# Patient Record
Sex: Female | Born: 1980 | Race: White | Hispanic: No | Marital: Married | Smoking: Never smoker
Health system: Southern US, Community
[De-identification: ages and names within clinical notes are randomized; demographics above are authoritative.]

## PROBLEM LIST (undated history)

## (undated) DIAGNOSIS — K5090 Crohn's disease, unspecified, without complications: Secondary | ICD-10-CM

---

## 2006-03-04 ENCOUNTER — Observation Stay: Payer: Self-pay | Admitting: Unknown Physician Specialty

## 2006-03-23 ENCOUNTER — Inpatient Hospital Stay: Payer: Self-pay

## 2007-12-15 ENCOUNTER — Emergency Department: Payer: Self-pay | Admitting: Emergency Medicine

## 2009-09-16 ENCOUNTER — Observation Stay: Payer: Self-pay | Admitting: Internal Medicine

## 2009-10-03 ENCOUNTER — Inpatient Hospital Stay: Payer: Self-pay

## 2012-12-15 ENCOUNTER — Other Ambulatory Visit: Payer: Self-pay | Admitting: Unknown Physician Specialty

## 2012-12-17 LAB — CLOSTRIDIUM DIFFICILE BY PCR

## 2013-01-05 ENCOUNTER — Other Ambulatory Visit: Payer: Self-pay | Admitting: Unknown Physician Specialty

## 2013-01-05 LAB — CLOSTRIDIUM DIFFICILE BY PCR

## 2013-06-16 ENCOUNTER — Other Ambulatory Visit: Payer: Self-pay | Admitting: Unknown Physician Specialty

## 2013-06-16 LAB — CLOSTRIDIUM DIFFICILE(ARMC)

## 2013-06-19 LAB — STOOL CULTURE

## 2013-07-22 ENCOUNTER — Other Ambulatory Visit: Payer: Self-pay

## 2016-04-25 ENCOUNTER — Other Ambulatory Visit
Admission: RE | Admit: 2016-04-25 | Discharge: 2016-04-25 | Disposition: A | Discharge status: Home / Self Care | Payer: Self-pay | Source: Ambulatory Visit | Attending: Student | Admitting: Student

## 2016-04-25 DIAGNOSIS — Z8619 Personal history of other infectious and parasitic diseases: Secondary | ICD-10-CM | POA: Insufficient documentation

## 2016-04-25 LAB — C DIFFICILE QUICK SCREEN W PCR REFLEX
C DIFFICILE (CDIFF) TOXIN: NEGATIVE
C DIFFICLE (CDIFF) ANTIGEN: NEGATIVE
C Diff interpretation: NOT DETECTED

## 2016-11-28 ENCOUNTER — Other Ambulatory Visit
Admission: RE | Admit: 2016-11-28 | Discharge: 2016-11-28 | Disposition: A | Discharge status: Home / Self Care | Payer: Self-pay | Source: Ambulatory Visit | Attending: Student | Admitting: Student

## 2016-11-28 DIAGNOSIS — K51911 Ulcerative colitis, unspecified with rectal bleeding: Secondary | ICD-10-CM | POA: Insufficient documentation

## 2016-11-28 LAB — GASTROINTESTINAL PANEL BY PCR, STOOL (REPLACES STOOL CULTURE)
Adenovirus F40/41: NOT DETECTED
Astrovirus: NOT DETECTED
CAMPYLOBACTER SPECIES: NOT DETECTED
CRYPTOSPORIDIUM: NOT DETECTED
Cyclospora cayetanensis: NOT DETECTED
ENTEROAGGREGATIVE E COLI (EAEC): NOT DETECTED
Entamoeba histolytica: NOT DETECTED
Enteropathogenic E coli (EPEC): NOT DETECTED
Enterotoxigenic E coli (ETEC): NOT DETECTED
GIARDIA LAMBLIA: NOT DETECTED
NOROVIRUS GI/GII: NOT DETECTED
PLESIMONAS SHIGELLOIDES: NOT DETECTED
Rotavirus A: NOT DETECTED
SAPOVIRUS (I, II, IV, AND V): NOT DETECTED
Salmonella species: NOT DETECTED
Shiga like toxin producing E coli (STEC): NOT DETECTED
Shigella/Enteroinvasive E coli (EIEC): NOT DETECTED
Vibrio cholerae: NOT DETECTED
Vibrio species: NOT DETECTED
YERSINIA ENTEROCOLITICA: NOT DETECTED

## 2016-11-28 LAB — C DIFFICILE QUICK SCREEN W PCR REFLEX
C Diff antigen: NEGATIVE
C Diff interpretation: NOT DETECTED
C Diff toxin: NEGATIVE

## 2016-12-25 ENCOUNTER — Other Ambulatory Visit
Admission: RE | Admit: 2016-12-25 | Discharge: 2016-12-25 | Disposition: A | Discharge status: Home / Self Care | Payer: Self-pay | Source: Ambulatory Visit | Attending: Student | Admitting: Student

## 2016-12-25 DIAGNOSIS — K51811 Other ulcerative colitis with rectal bleeding: Secondary | ICD-10-CM | POA: Insufficient documentation

## 2016-12-25 LAB — C DIFFICILE QUICK SCREEN W PCR REFLEX
C DIFFICILE (CDIFF) INTERP: NOT DETECTED
C DIFFICILE (CDIFF) TOXIN: NEGATIVE
C DIFFICLE (CDIFF) ANTIGEN: NEGATIVE

## 2017-03-31 ENCOUNTER — Other Ambulatory Visit
Admission: RE | Admit: 2017-03-31 | Discharge: 2017-03-31 | Disposition: A | Discharge status: Home / Self Care | Payer: BLUE CROSS/BLUE SHIELD | Source: Ambulatory Visit | Attending: Student | Admitting: Student

## 2017-03-31 DIAGNOSIS — K51911 Ulcerative colitis, unspecified with rectal bleeding: Secondary | ICD-10-CM | POA: Insufficient documentation

## 2017-04-01 ENCOUNTER — Other Ambulatory Visit
Admission: RE | Admit: 2017-04-01 | Discharge: 2017-04-01 | Disposition: A | Discharge status: Home / Self Care | Payer: BLUE CROSS/BLUE SHIELD | Source: Ambulatory Visit | Attending: Student | Admitting: Student

## 2017-04-01 DIAGNOSIS — K51911 Ulcerative colitis, unspecified with rectal bleeding: Secondary | ICD-10-CM | POA: Diagnosis present

## 2017-04-01 LAB — C DIFFICILE QUICK SCREEN W PCR REFLEX
C Diff antigen: NEGATIVE
C Diff interpretation: NOT DETECTED
C Diff toxin: NEGATIVE

## 2017-04-01 LAB — GASTROINTESTINAL PANEL BY PCR, STOOL (REPLACES STOOL CULTURE)
ADENOVIRUS F40/41: NOT DETECTED
Astrovirus: NOT DETECTED
CAMPYLOBACTER SPECIES: NOT DETECTED
CRYPTOSPORIDIUM: NOT DETECTED
CYCLOSPORA CAYETANENSIS: NOT DETECTED
ENTAMOEBA HISTOLYTICA: NOT DETECTED
ENTEROPATHOGENIC E COLI (EPEC): NOT DETECTED
Enteroaggregative E coli (EAEC): NOT DETECTED
Enterotoxigenic E coli (ETEC): NOT DETECTED
Giardia lamblia: NOT DETECTED
Norovirus GI/GII: NOT DETECTED
PLESIMONAS SHIGELLOIDES: NOT DETECTED
Rotavirus A: NOT DETECTED
SAPOVIRUS (I, II, IV, AND V): NOT DETECTED
Salmonella species: NOT DETECTED
Shiga like toxin producing E coli (STEC): NOT DETECTED
Shigella/Enteroinvasive E coli (EIEC): NOT DETECTED
VIBRIO CHOLERAE: NOT DETECTED
VIBRIO SPECIES: NOT DETECTED
YERSINIA ENTEROCOLITICA: NOT DETECTED

## 2017-04-07 LAB — MISCELLANEOUS TEST

## 2017-06-10 ENCOUNTER — Other Ambulatory Visit
Admission: RE | Admit: 2017-06-10 | Discharge: 2017-06-10 | Disposition: A | Discharge status: Home / Self Care | Payer: BLUE CROSS/BLUE SHIELD | Source: Ambulatory Visit | Attending: Student | Admitting: Student

## 2017-06-10 DIAGNOSIS — R197 Diarrhea, unspecified: Secondary | ICD-10-CM | POA: Diagnosis present

## 2017-06-10 LAB — GASTROINTESTINAL PANEL BY PCR, STOOL (REPLACES STOOL CULTURE)

## 2017-06-10 LAB — C DIFFICILE QUICK SCREEN W PCR REFLEX
C DIFFICILE (CDIFF) TOXIN: NEGATIVE
C DIFFICLE (CDIFF) ANTIGEN: NEGATIVE
C Diff interpretation: NOT DETECTED

## 2017-06-15 LAB — STOOL CULTURE: E coli, Shiga toxin Assay: NEGATIVE

## 2017-06-15 LAB — STOOL CULTURE REFLEX - CMPCXR

## 2017-06-15 LAB — STOOL CULTURE REFLEX - RSASHR

## 2017-08-27 ENCOUNTER — Other Ambulatory Visit: Payer: Self-pay | Admitting: General Surgery

## 2017-08-27 ENCOUNTER — Ambulatory Visit
Admission: RE | Admit: 2017-08-27 | Discharge: 2017-08-27 | Disposition: A | Discharge status: Home / Self Care | Payer: BLUE CROSS/BLUE SHIELD | Source: Ambulatory Visit | Attending: General Surgery | Admitting: General Surgery

## 2017-08-27 DIAGNOSIS — N83202 Unspecified ovarian cyst, left side: Secondary | ICD-10-CM | POA: Diagnosis not present

## 2017-08-27 DIAGNOSIS — L0291 Cutaneous abscess, unspecified: Secondary | ICD-10-CM

## 2017-08-27 DIAGNOSIS — K610 Anal abscess: Secondary | ICD-10-CM | POA: Diagnosis not present

## 2017-08-27 MED ORDER — IOHEXOL 300 MG/ML  SOLN
100.0000 mL | Freq: Once | INTRAMUSCULAR | Status: AC | PRN
  Administered 2017-08-27: 100 mL via INTRAVENOUS

## 2018-07-22 ENCOUNTER — Other Ambulatory Visit
Admission: RE | Admit: 2018-07-22 | Discharge: 2018-07-22 | Disposition: A | Discharge status: Home / Self Care | Payer: BLUE CROSS/BLUE SHIELD | Source: Ambulatory Visit | Attending: Unknown Physician Specialty | Admitting: Unknown Physician Specialty

## 2018-07-22 DIAGNOSIS — K51811 Other ulcerative colitis with rectal bleeding: Secondary | ICD-10-CM | POA: Diagnosis present

## 2018-07-22 LAB — GASTROINTESTINAL PANEL BY PCR, STOOL (REPLACES STOOL CULTURE)

## 2018-07-22 LAB — C DIFFICILE QUICK SCREEN W PCR REFLEX
C Diff antigen: POSITIVE — AB
C Diff toxin: NEGATIVE

## 2018-07-22 LAB — CLOSTRIDIUM DIFFICILE BY PCR, REFLEXED

## 2018-07-23 LAB — CALPROTECTIN, FECAL: CALPROTECTIN, FECAL: 1382 ug/g — AB (ref 0–120)

## 2018-09-08 ENCOUNTER — Ambulatory Visit: Admission: RE | Admit: 2018-09-08 | Payer: BLUE CROSS/BLUE SHIELD | Source: Ambulatory Visit

## 2018-09-08 ENCOUNTER — Other Ambulatory Visit: Payer: Self-pay | Admitting: General Surgery

## 2018-09-08 ENCOUNTER — Other Ambulatory Visit: Payer: Self-pay

## 2018-09-08 ENCOUNTER — Ambulatory Visit
Admission: RE | Admit: 2018-09-08 | Discharge: 2018-09-08 | Disposition: A | Discharge status: Home / Self Care | Payer: BLUE CROSS/BLUE SHIELD | Source: Ambulatory Visit | Attending: General Surgery | Admitting: General Surgery

## 2018-09-08 ENCOUNTER — Other Ambulatory Visit (HOSPITAL_COMMUNITY): Payer: Self-pay | Admitting: General Surgery

## 2018-09-08 DIAGNOSIS — L0291 Cutaneous abscess, unspecified: Secondary | ICD-10-CM | POA: Diagnosis present

## 2018-09-08 MED ORDER — IOHEXOL 300 MG/ML  SOLN
100.0000 mL | Freq: Once | INTRAMUSCULAR | Status: AC | PRN
  Administered 2018-09-08: 100 mL via INTRAVENOUS

## 2018-09-22 ENCOUNTER — Other Ambulatory Visit
Admission: RE | Admit: 2018-09-22 | Discharge: 2018-09-22 | Disposition: A | Discharge status: Home / Self Care | Payer: BLUE CROSS/BLUE SHIELD | Source: Ambulatory Visit | Attending: Student | Admitting: Student

## 2018-09-22 DIAGNOSIS — K5190 Ulcerative colitis, unspecified, without complications: Secondary | ICD-10-CM | POA: Insufficient documentation

## 2018-09-22 LAB — GASTROINTESTINAL PANEL BY PCR, STOOL (REPLACES STOOL CULTURE)

## 2018-09-24 LAB — CALPROTECTIN, FECAL: Calprotectin, Fecal: 1190 ug/g — ABNORMAL HIGH (ref 0–120)

## 2019-12-16 IMAGING — CT CT PELVIS WITH CONTRAST
2 of 3 series · 16 of 46 positions shown, 18 images · IV contrast (omnipaque)
Comparison: 08/27/2016

CLINICAL DATA: Perirectal pain and erythema since September 06, 2018.
History of ulcerative colitis. History of perianal abscess about 1
year ago.

EXAM:
CT PELVIS WITH CONTRAST
TECHNIQUE: Multidetector CT imaging of the pelvis was performed using the
standard protocol following the bolus administration of intravenous
contrast.
CONTRAST:  100mL OMNIPAQUE IOHEXOL 300 MG/ML  SOLN

[Series 2: pelvis · axial · 0.77mm/px · z∈[-1607,-1317]mm · 13 of 68 slices shown, 15 images (1 of 2)]
[im 5/68  soft-tissue]
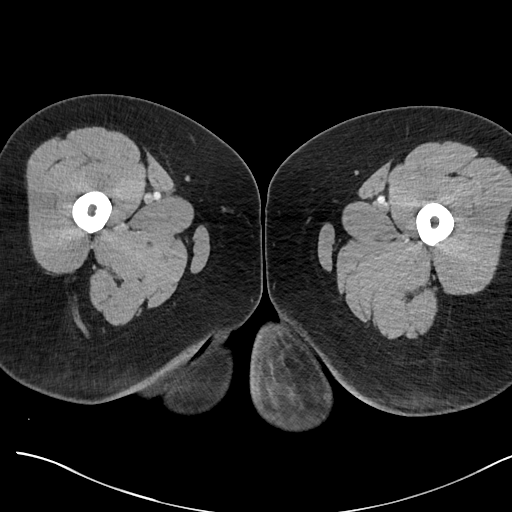
[im 5/68  bone]
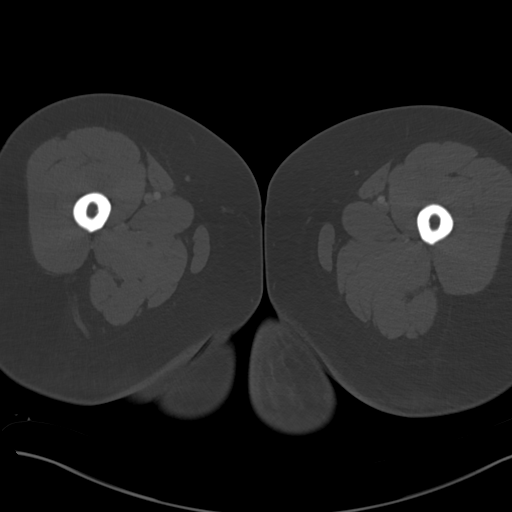
[im 9/68  soft-tissue]
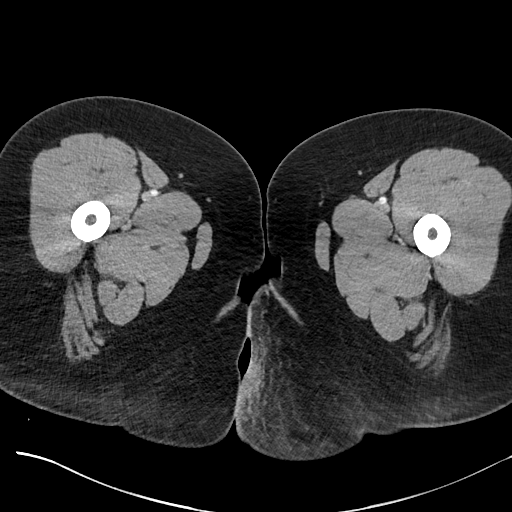
[im 13/68  soft-tissue]
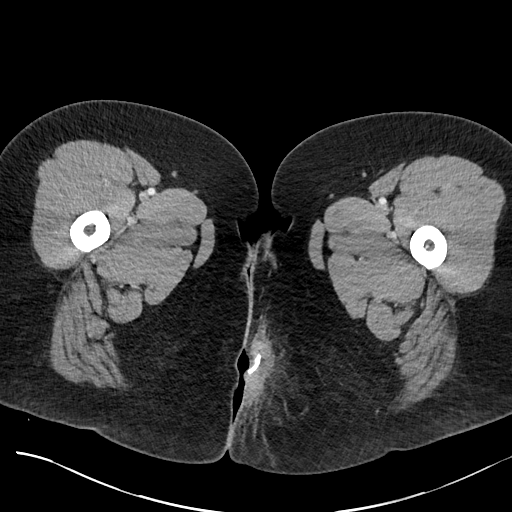
[im 20/68  soft-tissue]
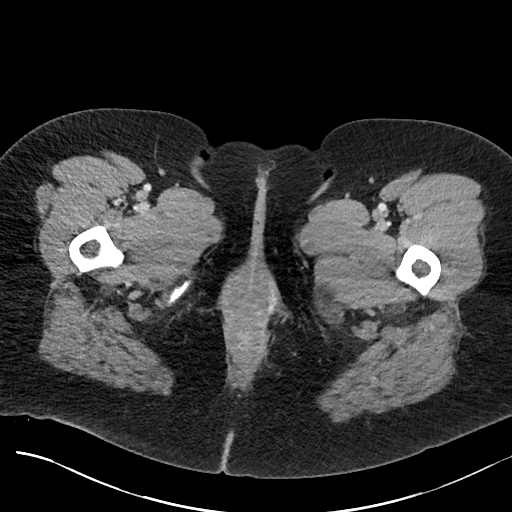
[im 24/68  soft-tissue]
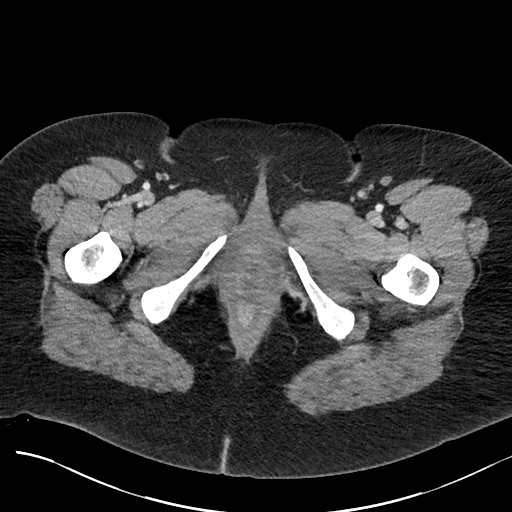
[im 29/68  soft-tissue]
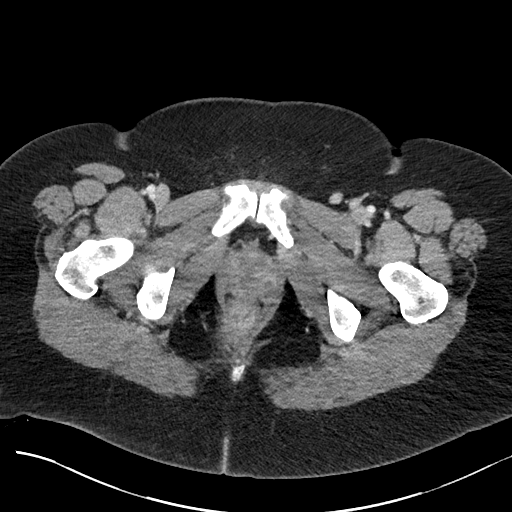
[im 35/68  soft-tissue]
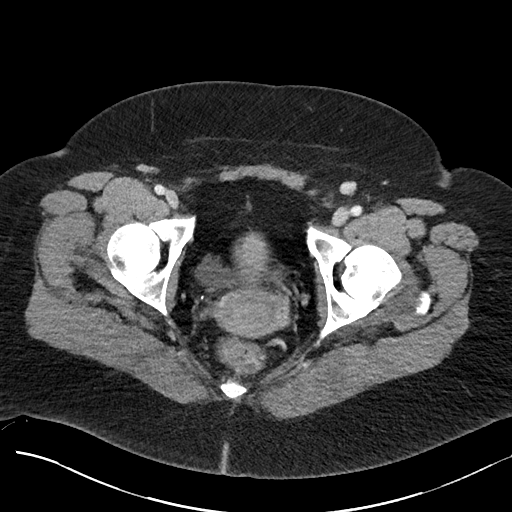
[im 39/68  soft-tissue]
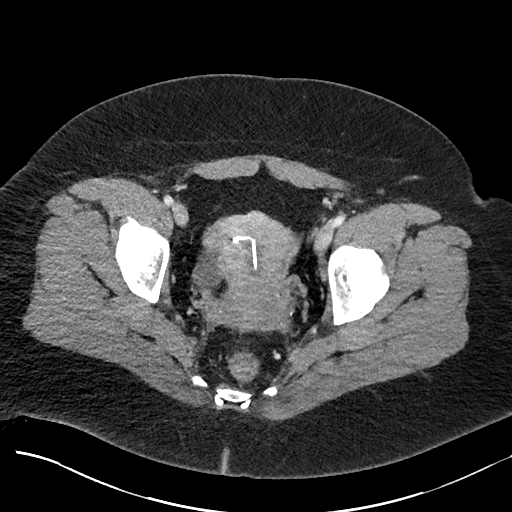
[im 44/68  soft-tissue]
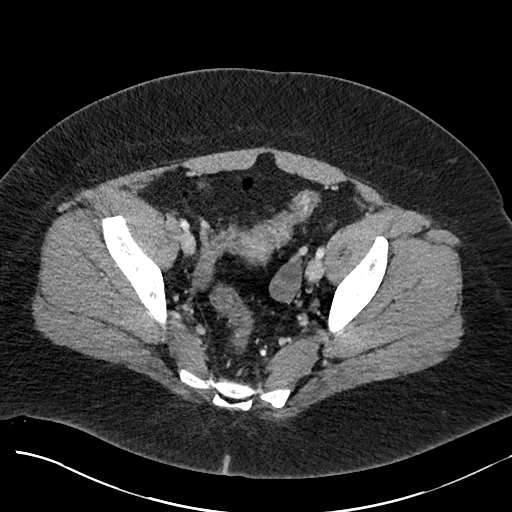
[im 44/68  bone]
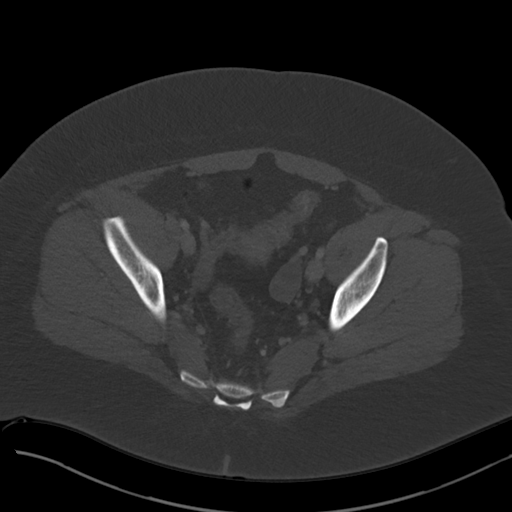
[im 48/68  soft-tissue]
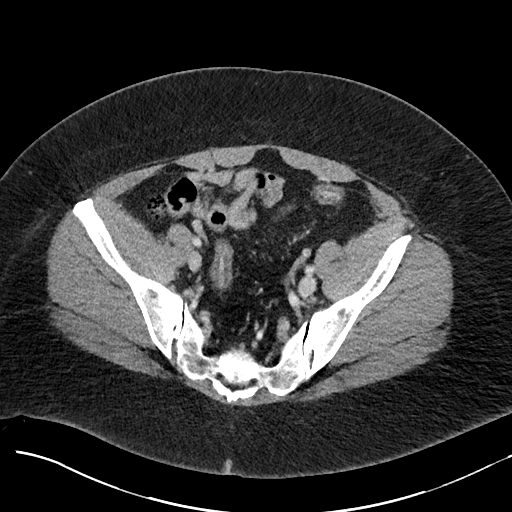
[im 55/68  soft-tissue]
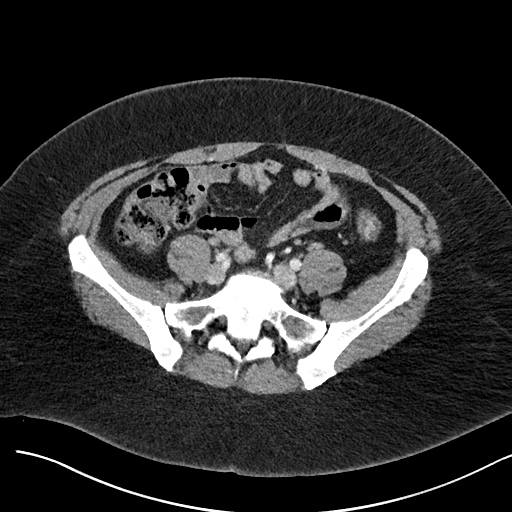
[im 59/68  soft-tissue]
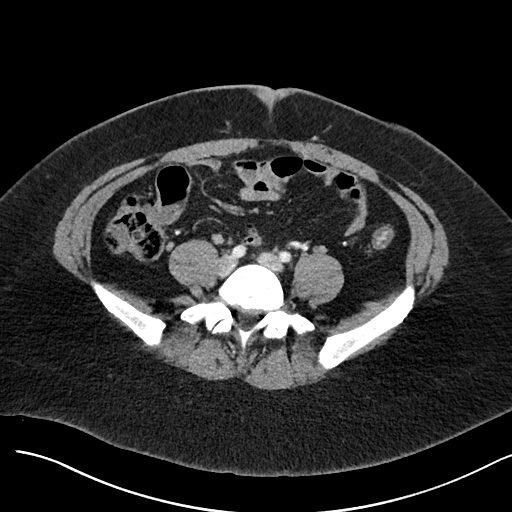
[im 63/68  soft-tissue]
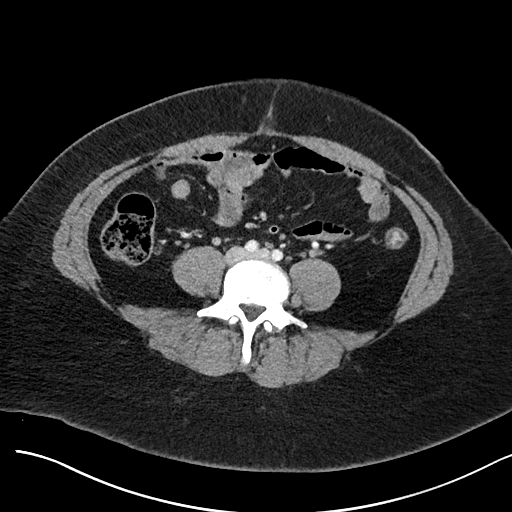

[Series 3: pelvis · coronal · 0.67mm/px · 3 of 164 slices shown (2 of 2)]
[im 55/164  soft-tissue]
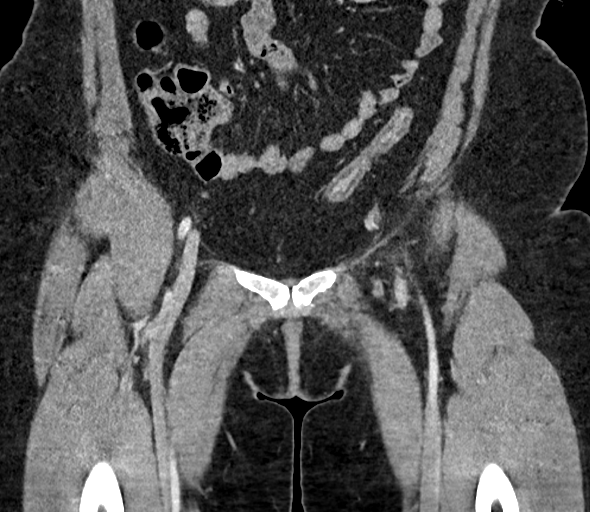
[im 73/164  soft-tissue]
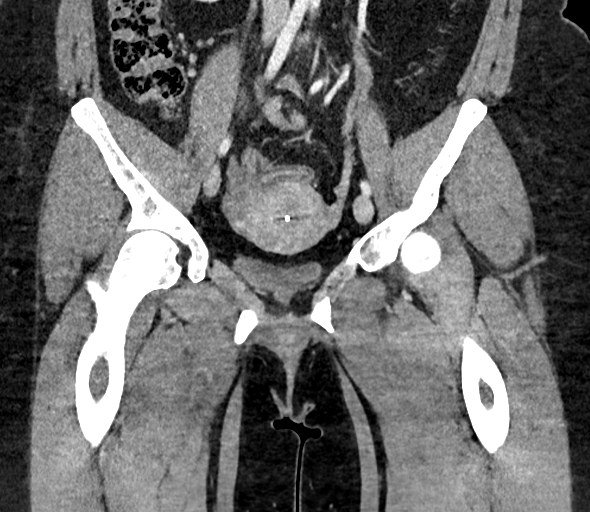
[im 91/164  soft-tissue]
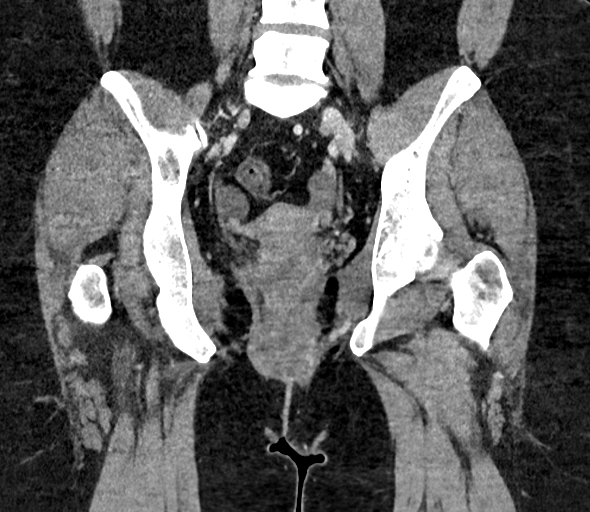

[16 of 46 positions shown; findings below may reference images not displayed]

FINDINGS: Urinary Tract:  Unremarkable

Bowel: Adipose tissue in the wall of the descending and sigmoid
colon is noted. This can be normal but has a weak association with
inflammatory bowel disease. Normal appendix. Nondistended distal
colon with equivocal accentuated mucosal enhancement. No significant
paracolic stranding or discrete paracolic abscess. No appreciable
abscess in the perirectal space or ischiorectal fossa.

However, along the left perineum and left medial upper thigh upper
thigh region there is abnormal inflammatory stranding along the
cutaneous and subcutaneous tissues with a ring-like radiodensity in
this vicinity which may represent a drain, seton, gauze packing, or
other foreign object. I do not see a well-defined perianal fistula
extending to this region, although such a fistula could be obscured
in the surrounding soft tissues and would be more visible with
dedicated MRI.

Vascular/Lymphatic: Small left external iliac nodes are not
pathologically enlarged by size criteria.

Reproductive: An IUD is satisfactorily positioned along the
endometrium. 1.6 cm follicle or cyst in the left ovary. Mild
hypodense prominence of the right ovary possibly from an underlying
cyst, the right ovary measures 4.7 by 2.6 cm on image [DATE].

Other:  No supplemental non-categorized findings.

Musculoskeletal: Mild sclerosis along both sacroiliac joints.
IMPRESSION: 1. A ring shaped radiodensity along the inflammatory process along
the left medial upper thigh/perineum region probably represents the
previously placed seton, although gauze packing or a small drain
could have a similar appearance. There is some continued
inflammatory stranding in the cutaneous and subcutaneous tissues in
this vicinity and along the left lower buttock. I do not see
compelling findings of an active perianal fistula extending down to
this region, although a dedicated perianal fistula protocol MRI
would be more sensitive in demonstrating such a lesion. No
appreciable drainable abscess is observed.
2. Mild sclerosis along both sacroiliac joints compatible with
chronic sacroiliitis or osteitis condensans ilii.
3. Suspected small bilateral ovarian cysts.

## 2021-08-22 ENCOUNTER — Ambulatory Visit (INDEPENDENT_AMBULATORY_CARE_PROVIDER_SITE_OTHER): Payer: BC Managed Care – PPO

## 2021-08-22 ENCOUNTER — Encounter: Payer: Self-pay | Admitting: Emergency Medicine

## 2021-08-22 ENCOUNTER — Ambulatory Visit
Admission: EM | Admit: 2021-08-22 | Discharge: 2021-08-22 | Disposition: A | Discharge status: Home / Self Care | Payer: BC Managed Care – PPO

## 2021-08-22 DIAGNOSIS — J189 Pneumonia, unspecified organism: Secondary | ICD-10-CM

## 2021-08-22 DIAGNOSIS — R059 Cough, unspecified: Secondary | ICD-10-CM | POA: Diagnosis not present

## 2021-08-22 HISTORY — DX: Crohn's disease, unspecified, without complications: K50.90

## 2021-08-22 MED ORDER — PROMETHAZINE-DM 6.25-15 MG/5ML PO SYRP: ORAL_SOLUTION | ORAL | 0 refills | Status: AC

## 2021-08-22 MED ORDER — AZITHROMYCIN 250 MG PO TABS: ORAL_TABLET | ORAL | 0 refills | Status: AC

## 2021-08-22 MED ORDER — GUAIFENESIN ER 600 MG PO TB12: 600.0000 mg | ORAL_TABLET | Freq: Two times a day (BID) | ORAL | 0 refills | Status: AC | PRN

## 2021-08-22 NOTE — ED Triage Notes (Signed)
Pt presents with a productive cough x 1 week.  ?

## 2021-08-22 NOTE — ED Provider Notes (Signed)
?[facility] ? ? ? ?CSN: [id number] ?Arrival date & time: 08/22/21  [id number] ? ? ?  ? ?History   ?Chief Complaint ?Chief Complaint  ?Patient presents with  ? Cough  ? ? ?HPI ?Miranda West is a 41 y.o. female.  ? ?Pleasant 41 year old female with a history of Crohn's disease on DMARD therapy presents today with a several week history of upper respiratory symptoms.  She states 2 to 3 weeks ago she was having nasal congestion and drainage.  She has a known history of allergies and takes Xyzal nightly and Zyrtec every morning.  Her daughter was sick with similar symptoms at that time but has since improved.  She states over the the past week, it has moved to her chest.  She is coughing extensively, worse at night and in the morning, but still throughout the day as well.  Over the past several days her coughing has kept her up at night.  She does not feel any postnasal drainage.  She states she is having a soreness in her chest, but denies chest pain or palpitations.  She denies shortness of breath.  She states she is still able to get out and walk the dog.  She is coughing up bright yellow mucus, but no blood.  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  She did take one of her daughters benzonatate cough Perles about 2 hours prior to presentation and states it helped mildly. ? ? ?Cough ? ?Past Medical History:  ?Diagnosis Date  ? Crohn's disease ([place])   ? ? ?There are no problems to display for this patient. ? ? ?History reviewed. No pertinent surgical history. ? ?OB History   ?No obstetric history on file. ?  ? ? ? ?Home Medications   ? ?Prior to Admission medications   ?Medication Sig Start Date End Date Taking? Authorizing Provider  ?azaTHIOprine (IMURAN) 50 MG tablet Take 3 tablets by mouth daily. 10/26/18  Yes [provider]   ?azithromycin (ZITHROMAX Z-PAK) 250 MG tablet Take 2 tabs (500mg ) PO in a single dose today, followed by 1 tab PO days 2-5. 08/22/21  Yes Keshayla Schrum L, PA  ?guaiFENesin (MUCINEX) 600 MG 12 hr tablet Take 1 tablet (600 mg total) by mouth 2 (two) times daily as needed for cough or to loosen phlegm. 08/22/21  Yes Allana Shrestha L, PA  ?omeprazole (PRILOSEC) 40 MG capsule TAKE 1 CAPSULE BY MOUTH TWO TIMES A DAY. 02/01/19  Yes [provider]  ?promethazine-dextromethorphan (PROMETHAZINE-DM) 6.25-15 MG/5ML syrup Take 102mL PO nightly as needed for cough 08/22/21  Yes Woodruff Skirvin L, PA  ?cetirizine (ZYRTEC) 10 MG tablet Take by mouth.    [provider]  ?inFLIXimab (REMICADE) 100 MG injection Inject into the vein.    [provider]  ?levonorgestrel (MIRENA) 20 MCG/DAY IUD by Intrauterine route.    [provider]  ? ? ?Family History ?History reviewed. No pertinent family history. ? ?Social History ?Social History  ? ?Tobacco Use  ? Smoking status: Never  ? Smokeless tobacco: Never  ?Vaping Use  ? Vaping Use: Never used  ?Substance Use Topics  ? Alcohol use: Not Currently  ? Drug use: Never  ? ? ? ?Allergies   ?Acetaminophen ? ? ?Review of Systems ?Review of Systems  ?HENT:  Positive for  congestion.   ?Respiratory:  Positive for cough.   ?All other systems reviewed and are negative. ? ? ?Physical Exam ?Triage Vital Signs ?ED Triage Vitals  ?Enc Vitals Group  ?   BP 08/22/21 0843 (!) 122/91  ?   Pulse Rate 08/22/21 0843 97  ?   Resp 08/22/21 0843 18  ?   Temp 08/22/21 0843 98.4 ?F (36.9 ?C)  ?   Temp src --   ?   SpO2 08/22/21 0843 97 %  ?   Weight --   ?   Height --   ?   Head Circumference --   ?   Peak Flow --   ?   Pain Score 08/22/21 0844 0  ?   Pain Loc --   ?   Pain Edu? --   ?   Excl. in [place]? --   ? ?No data found. ? ?Updated Vital Signs ?BP (!) 122/91   Pulse 97   Temp 98.4 ?F (36.9 ?C)   Resp 18   SpO2 97%  ? ?Visual Acuity ?Right Eye Distance:   ?Left Eye Distance:    ?Bilateral Distance:   ? ?Right Eye Near:   ?Left Eye Near:    ?Bilateral Near:    ? ?Physical Exam ?Vitals and nursing note reviewed.  ?Constitutional:   ?   General: She is not in acute distress. ?   Appearance: Normal appearance. She is well-developed. She is obese. She is not ill-appearing, toxic-appearing or diaphoretic.  ?HENT:  ?   Head: Normocephalic and atraumatic.  ?   Right Ear: Tympanic membrane, ear canal and external ear normal. There is no impacted cerumen.  ?   Left Ear: Tympanic membrane, ear canal and external ear normal. There is no impacted cerumen.  ?   Nose: Nose normal. No congestion or rhinorrhea.  ?   Mouth/Throat:  ?   Mouth: Mucous membranes are moist.  ?   Pharynx: Oropharynx is clear. No oropharyngeal exudate or posterior oropharyngeal erythema.  ?Eyes:  ?   General:     ?   Right eye: No discharge.     ?   Left eye: No discharge.  ?   Extraocular Movements: Extraocular movements intact.  ?   Conjunctiva/sclera: Conjunctivae normal.  ?   Pupils: Pupils are equal, round, and reactive to light.  ?Cardiovascular:  ?   Rate and Rhythm: Normal rate and regular rhythm.  ?   Pulses: Normal pulses.  ?   Heart sounds: Normal heart sounds. No murmur heard. ?Pulmonary:  ?   Effort: Pulmonary effort is normal. No respiratory distress.  ?   Breath sounds: No stridor. Rhonchi (and crackles to bilateral lung bases, minimally on L, prominent on R) present. No wheezing or rales.  ?Abdominal:  ?   Palpations: Abdomen is soft.  ?   Tenderness: There is no abdominal tenderness.  ?Musculoskeletal:     ?   General: No swelling.  ?   Cervical back: Normal range of motion and neck supple. No rigidity.  ?   Right lower leg: No edema.  ?   Left lower leg: No edema.  ?Lymphadenopathy:  ?   Cervical: No cervical adenopathy.  ?Skin: ?   General: Skin is warm and dry.  ?   Capillary Refill: Capillary refill takes less than 2 seconds.  ?   Findings: No erythema.  ?Neurological:  ?   General: No focal deficit present.   ?   Mental Status: She  is alert and oriented to person, place, and time.  ?Psychiatric:     ?   Mood and Affect: Mood normal.  ? ? ? ?UC Treatments / Results  ?Labs ?(all labs ordered are listed, but only abnormal results are displayed) ?Labs Reviewed - No data to display ? ?EKG ? ? ?Radiology ?DG Chest 2 View ? ?Result Date: 08/22/2021 ?CLINICAL DATA:  Cough EXAM: CHEST - 2 VIEW COMPARISON:  None. FINDINGS: The heart size and mediastinal contours are within normal limits. Mild, diffuse bilateral interstitial opacity. The visualized skeletal structures are unremarkable. IMPRESSION: Mild, diffuse bilateral interstitial opacity, consistent with edema or atypical/viral infection. No focal airspace opacity. Electronically Signed   By: Delanna Ahmadi M.D.   On: 08/22/2021 09:34   ? ?Procedures ?Procedures (including critical care time) ? ?Medications Ordered in UC ?Medications - No data to display ? ?Initial Impression / Assessment and Plan / UC Course  ?I have reviewed the triage vital signs and the nursing notes. ? ?Pertinent labs & imaging results that were available during my care of the patient were reviewed by me and considered in my medical decision making (see chart for details). ? ?  ? ?Atypical pneumonia - will tx with azithromycin. Cough suppressants only at night to aid with sleep, mucinex during the day. Increase water intake, follow up with PCP in 6 weeks for recheck CXR to ensure resolution. Any new/ worsening sx, RTC or ER ? ?Final Clinical Impressions(s) / UC Diagnoses  ? ?Final diagnoses:  ?Atypical pneumonia  ? ? ? ?Discharge Instructions   ? ?  ?Your symptoms are consistent with an atypical pneumonia, which is seen commonly in younger individuals. ?We treat this with azithromycin, or "Zpack", an antibiotic. ?Please take it exactly as prescribed. ?Please ONLY take the cough medication at night to help you sleep. During the day, take mucinex to help break up the phlegm. ?Drink plenty of water. Follow up  with PCP in 6-8 weeks for recheck chest xray. ?If any new or worsening cough, chest pain, or fever, please return to clinic or head to ER ? ? ? ? ?ED Prescriptions   ? ? Medication Sig Dispense Auth. Provider

## 2021-08-22 NOTE — Discharge Instructions (Addendum)
Your symptoms are consistent with an atypical pneumonia, which is seen commonly in younger individuals. ?We treat this with azithromycin, or "Zpack", an antibiotic. ?Please take it exactly as prescribed. ?Please ONLY take the cough medication at night to help you sleep. During the day, take mucinex to help break up the phlegm. ?Drink plenty of water. Follow up with PCP in 6-8 weeks for recheck chest xray. ?If any new or worsening cough, chest pain, or fever, please return to clinic or head to ER ?

## 2021-12-03 ENCOUNTER — Ambulatory Visit
Admission: EM | Admit: 2021-12-03 | Discharge: 2021-12-03 | Disposition: A | Discharge status: Home / Self Care | Payer: BC Managed Care – PPO

## 2021-12-03 ENCOUNTER — Ambulatory Visit
Admission: RE | Admit: 2021-12-03 | Discharge: 2021-12-03 | Disposition: A | Discharge status: Home / Self Care | Payer: BC Managed Care – PPO | Attending: Emergency Medicine | Admitting: Emergency Medicine

## 2021-12-03 ENCOUNTER — Encounter: Payer: Self-pay | Admitting: Emergency Medicine

## 2021-12-03 ENCOUNTER — Other Ambulatory Visit: Payer: Self-pay

## 2021-12-03 ENCOUNTER — Ambulatory Visit
Admission: RE | Admit: 2021-12-03 | Discharge: 2021-12-03 | Disposition: A | Discharge status: Home / Self Care | Payer: BC Managed Care – PPO | Source: Ambulatory Visit | Attending: Emergency Medicine | Admitting: Emergency Medicine

## 2021-12-03 DIAGNOSIS — J0100 Acute maxillary sinusitis, unspecified: Secondary | ICD-10-CM

## 2021-12-03 DIAGNOSIS — R058 Other specified cough: Secondary | ICD-10-CM | POA: Insufficient documentation

## 2021-12-03 DIAGNOSIS — J209 Acute bronchitis, unspecified: Secondary | ICD-10-CM

## 2021-12-03 MED ORDER — AMOXICILLIN-POT CLAVULANATE 875-125 MG PO TABS: 1.0000 | ORAL_TABLET | Freq: Two times a day (BID) | ORAL | 0 refills | Status: AC

## 2021-12-03 NOTE — ED Provider Notes (Signed)
Renaldo Fiddler    CSN: [id number] Arrival date & time: 12/03/21  0915      History   Chief Complaint Chief Complaint  Patient presents with   Cough   Chest Congestion     HPI Miranda West is a 41 y.o. female.  Patient presents with 3 week history of congestion and cough.  History of pneumonia.  She also has right ear pain today.  She denies fever, chest pain, shortness of breath, or other symptoms.  Treatment attempted with Mucinex and Allegra.  Patient was seen at this urgent care on 08/22/2021 seen next week for atypical pneumonia; treated with Zithromax, guaifenesin, Promethazine DM.  The history is provided by the patient and medical records.    Past Medical History:  Diagnosis Date   Crohn's disease ([facility])     There are no problems to display for this patient.   History reviewed. No pertinent surgical history.  OB History   No obstetric history on file.      Home Medications    Prior to Admission medications   Medication Sig Start Date End Date Taking? Authorizing Provider  amoxicillin-clavulanate (AUGMENTIN) 875-125 MG tablet Take 1 tablet by mouth every 12 (twelve) hours for 10 days. 12/03/21 12/13/21 Yes Mickie Bail, NP  azaTHIOprine (IMURAN) 50 MG tablet Take 3 tablets by mouth daily. 10/26/18  Yes [provider]  fexofenadine (ALLEGRA) 180 MG tablet Take 180 mg by mouth daily.   Yes [provider]  guaiFENesin (MUCINEX) 600 MG 12 hr tablet Take 1 tablet (600 mg total) by mouth 2 (two) times daily as needed for cough or to loosen phlegm. 08/22/21  Yes Crain, Whitney L, PA  inFLIXimab (REMICADE) 100 MG injection Inject into the vein. "Every 8 weeks".   Yes [provider]  omeprazole (PRILOSEC) 40 MG capsule TAKE 1 CAPSULE BY MOUTH TWO TIMES A DAY. 02/01/19  Yes [provider]  azithromycin (ZITHROMAX Z-PAK) 250 MG tablet Take 2 tabs (500mg ) PO in a single dose today, followed by 1 tab PO days 2-5. 08/22/21   Crain,  Whitney L, PA  cetirizine (ZYRTEC) 10 MG tablet Take by mouth.    [provider]  levonorgestrel (MIRENA) 20 MCG/DAY IUD by Intrauterine route.    [provider]  promethazine-dextromethorphan (PROMETHAZINE-DM) 6.25-15 MG/5ML syrup Take 71mL PO nightly as needed for cough 08/22/21   10/22/21, PA    Family History History reviewed. No pertinent family history.  Social History Social History   Tobacco Use   Smoking status: Never   Smokeless tobacco: Never  Vaping Use   Vaping Use: Never used  Substance Use Topics   Alcohol use: Not Currently   Drug use: Never     Allergies   Acetaminophen   Review of Systems Review of Systems  Constitutional:  Negative for chills and fever.  HENT:  Positive for congestion and ear pain. Negative for sore throat.   Respiratory:  Positive for cough. Negative for shortness of breath.   Cardiovascular:  Negative for chest pain and palpitations.  Skin:  Negative for color change and rash.  All other systems reviewed and are negative.    Physical Exam Triage Vital Signs ED Triage Vitals  Enc Vitals Group     BP      Pulse      Resp      Temp      Temp src      SpO2  Weight      Height      Head Circumference      Peak Flow      Pain Score      Pain Loc      Pain Edu?      Excl. in [place]?    No data found.  Updated Vital Signs BP 125/84 (BP Location: Left Arm)   Pulse (!) 108   Temp 99.2 F (37.3 C) (Oral)   Resp 18   Ht 5\' 7"  (1.702 m)   Wt 260 lb (117.9 kg)   LMP  (LMP Unknown)   SpO2 95%   BMI 40.72 kg/m   Visual Acuity Right Eye Distance:   Left Eye Distance:   Bilateral Distance:    Right Eye Near:   Left Eye Near:    Bilateral Near:     Physical Exam Vitals and nursing note reviewed.  Constitutional:      General: She is not in acute distress.    Appearance: She is well-developed. She is obese. She is not ill-appearing.  HENT:     Right Ear: Ear canal normal. Tympanic  membrane is erythematous.     Left Ear: Tympanic membrane and ear canal normal.     Nose: Nose normal.     Mouth/Throat:     Mouth: Mucous membranes are moist.     Pharynx: Oropharynx is clear.  Cardiovascular:     Rate and Rhythm: Normal rate and regular rhythm.     Heart sounds: No murmur heard. Pulmonary:     Effort: Pulmonary effort is normal. No respiratory distress.     Breath sounds: Rhonchi present.     Comments: Faint rhonchi in RLL.  Musculoskeletal:     Cervical back: Neck supple.  Skin:    General: Skin is warm and dry.  Neurological:     Mental Status: She is alert.  Psychiatric:        Mood and Affect: Mood normal.        Behavior: Behavior normal.      UC Treatments / Results  Labs (all labs ordered are listed, but only abnormal results are displayed) Labs Reviewed - No data to display  EKG   Radiology DG Chest 2 View  Result Date: 12/03/2021 CLINICAL DATA:  Productive cough EXAM: CHEST - 2 VIEW COMPARISON:  Prior chest x-ray 08/22/2021 FINDINGS: The heart size and mediastinal contours are within normal limits. Both lungs are clear. The visualized skeletal structures are unremarkable. IMPRESSION: No active cardiopulmonary disease. Electronically Signed   By: 10/22/2021 M.D.   On: 12/03/2021 10:10    Procedures Procedures (including critical care time)  Medications Ordered in UC Medications - No data to display  Initial Impression / Assessment and Plan / UC Course  I have reviewed the triage vital signs and the nursing notes.  Pertinent labs & imaging results that were available during my care of the patient were reviewed by me and considered in my medical decision making (see chart for details).  Productive cough, acute sinusitis, acute bronchitis.  Chest x-ray negative.  Treating with Augmentin.  Tylenol or ibuprofen as needed for fever or discomfort.  Instructed patient to follow-up with her PCP for recheck.  Education provided on cough, sinus  infection, bronchitis.  Patient agrees to plan of care.   Final Clinical Impressions(s) / UC Diagnoses   Final diagnoses:  Productive cough  Acute non-recurrent maxillary sinusitis  Acute bronchitis, unspecified organism     Discharge  Instructions      Go to [facility] for your x-ray.    [address]  [address] [place], [place] [address] [phone]  I will call you with the results.       ED Prescriptions     Medication Sig Dispense Auth. Provider   amoxicillin-clavulanate (AUGMENTIN) 875-125 MG tablet Take 1 tablet by mouth every 12 (twelve) hours for 10 days. 20 tablet Mickie Bail, NP      PDMP not reviewed this encounter.   Mickie Bail, NP 12/03/21 1017

## 2021-12-03 NOTE — ED Triage Notes (Signed)
Patient c/o productive cough and chest congestion x 3 weeks.   Patient denies SOB. Patient denies fever at home.   Patient endorses an episode of emesis due to " coughing fits".   Patient endorses RT ear fullness.   Patient has taken Mucinex DM and Allegra with no relief of symptoms.

## 2021-12-03 NOTE — Discharge Instructions (Addendum)
Go to [facility] for your x-ray.    [address]  [address] [place],  [address] [phone]  I will call you with the results.   

## 2022-11-29 IMAGING — DX DG CHEST 2V
2 series · 2 of 2 positions shown · non-contrast
Comparison: None.

CLINICAL DATA: Cough

EXAM:
CHEST - 2 VIEW

[chest pa]
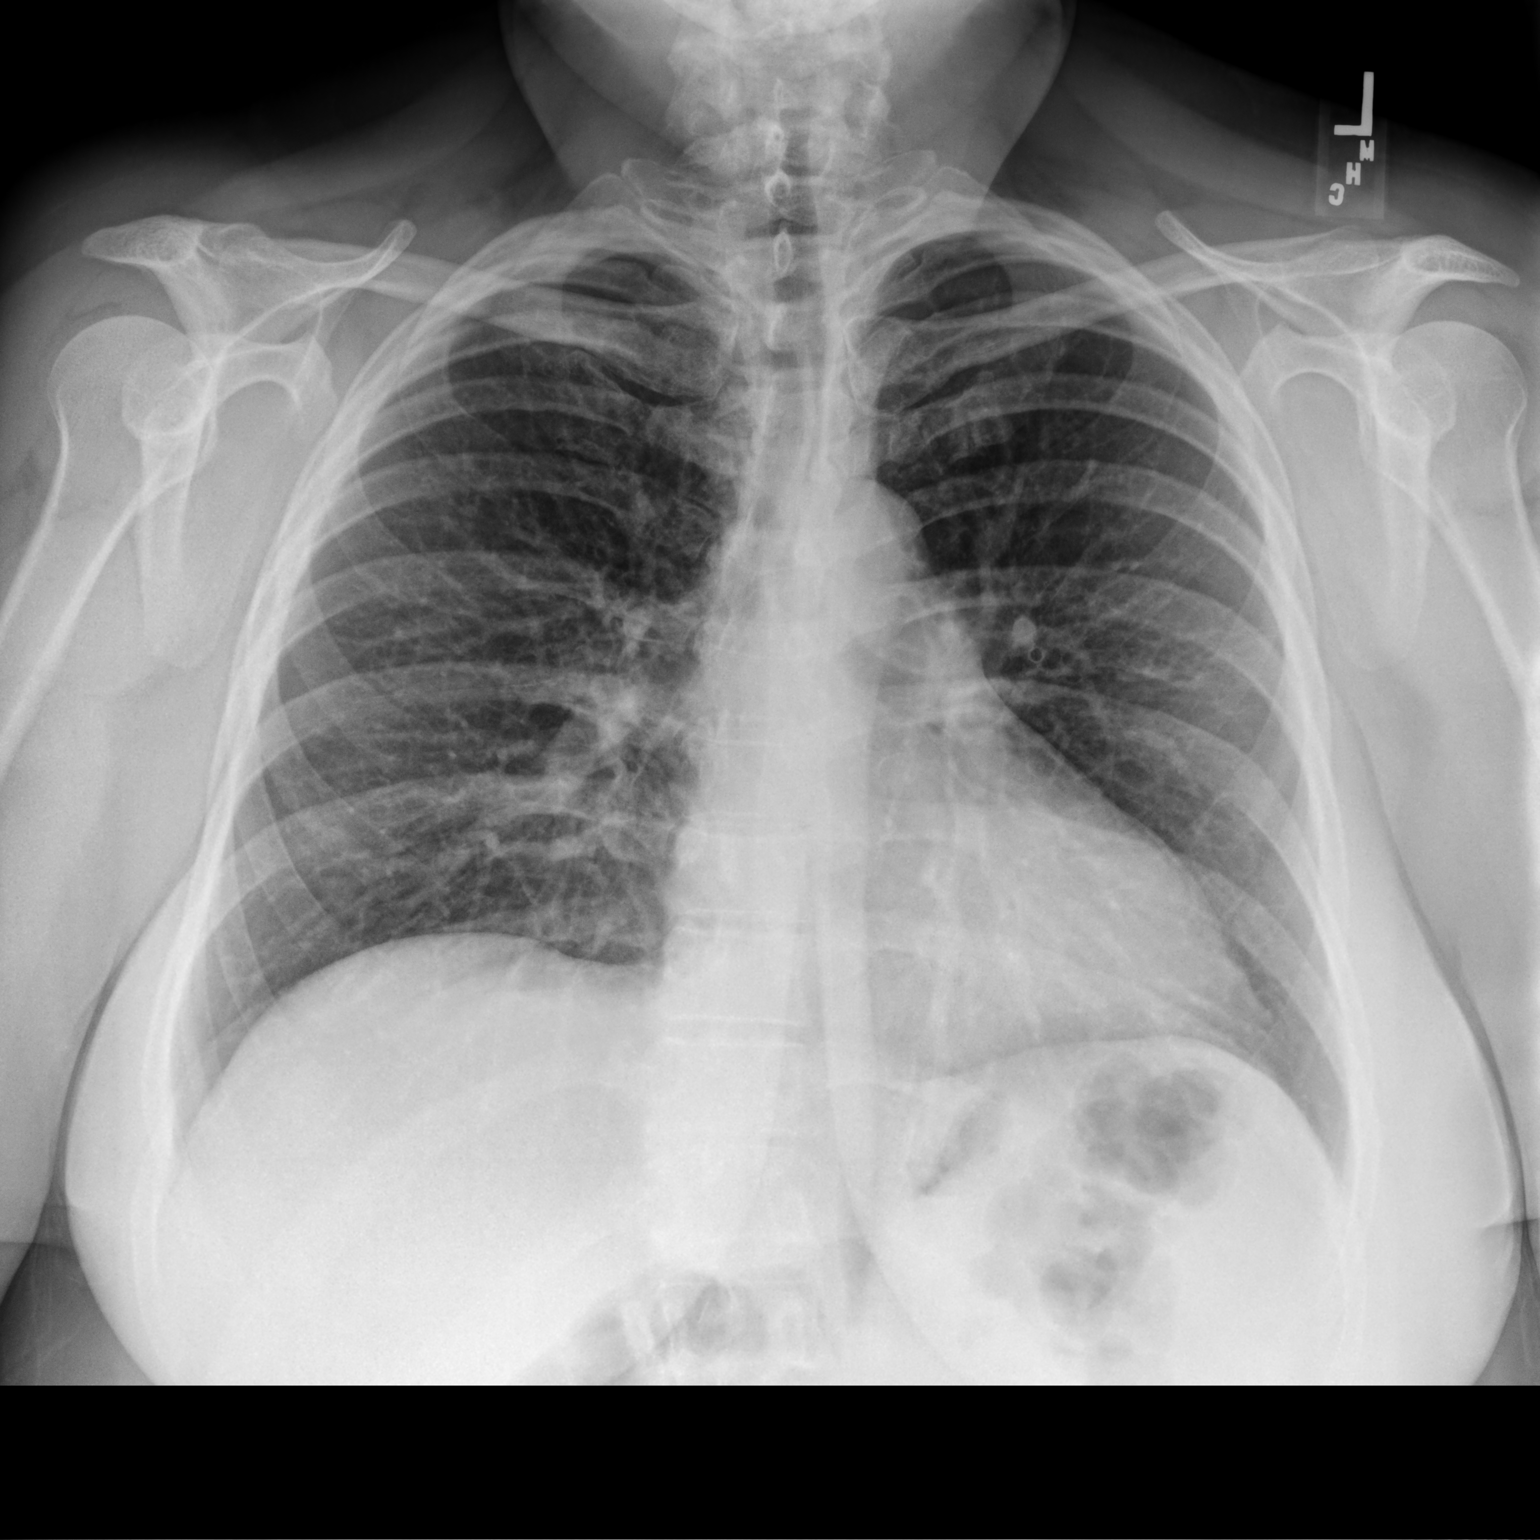

[chest lat]
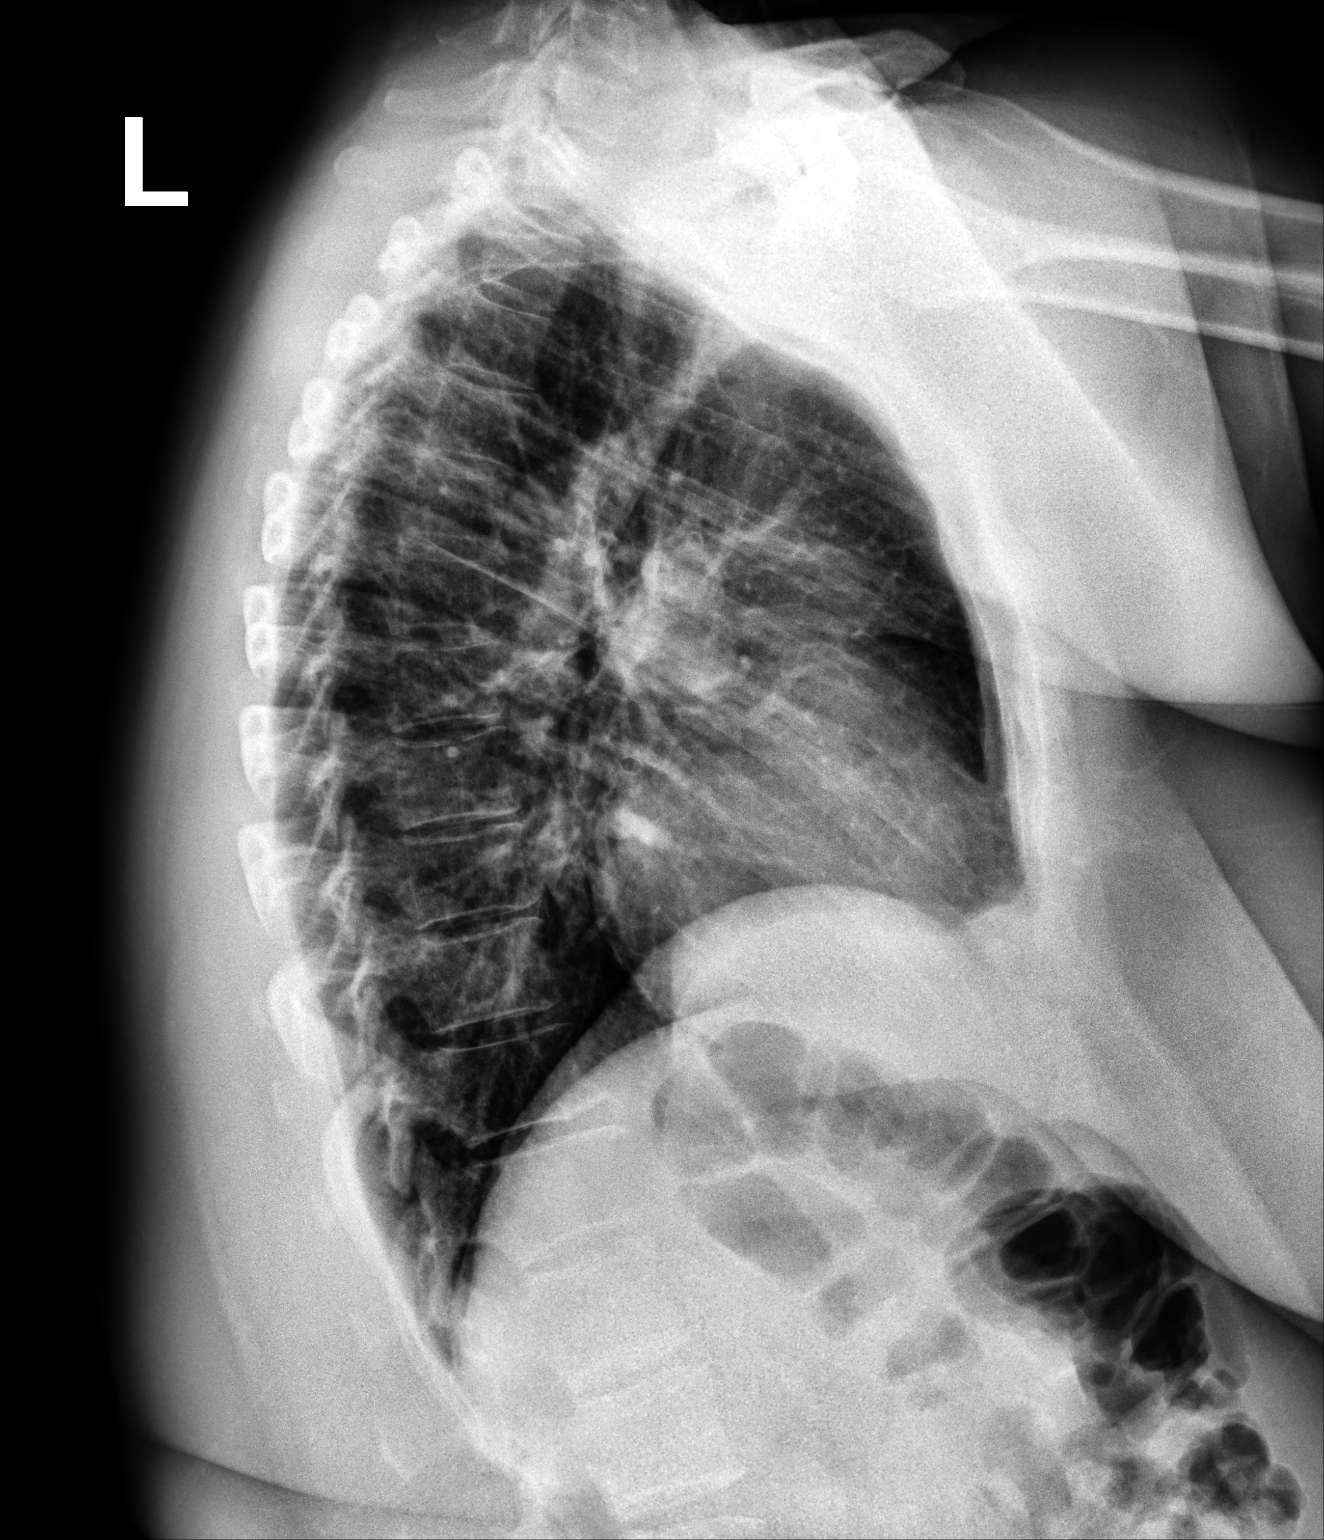

[2 of 2 positions shown; findings below may reference images not displayed]

FINDINGS: The heart size and mediastinal contours are within normal limits.
Mild, diffuse bilateral interstitial opacity. The visualized
skeletal structures are unremarkable.
IMPRESSION: Mild, diffuse bilateral interstitial opacity, consistent with edema
or atypical/viral infection. No focal airspace opacity.

## 2023-02-03 ENCOUNTER — Other Ambulatory Visit: Payer: Self-pay | Admitting: Obstetrics and Gynecology

## 2023-02-03 DIAGNOSIS — Z1231 Encounter for screening mammogram for malignant neoplasm of breast: Secondary | ICD-10-CM
